# Patient Record
Sex: Male | Born: 2000 | Race: White | Hispanic: No | Marital: Single | Smoking: Never smoker
Health system: Southern US, Community
[De-identification: ages and names within clinical notes are randomized; demographics above are authoritative.]

---
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
## 2021-08-18 ENCOUNTER — Encounter: Payer: Self-pay | Admitting: Emergency Medicine

## 2021-08-18 ENCOUNTER — Other Ambulatory Visit: Payer: Self-pay

## 2021-08-18 ENCOUNTER — Ambulatory Visit
Admission: EM | Admit: 2021-08-18 | Discharge: 2021-08-18 | Disposition: A | Discharge status: Home / Self Care | Payer: BC Managed Care – PPO

## 2021-08-18 DIAGNOSIS — L293 Anogenital pruritus, unspecified: Secondary | ICD-10-CM

## 2021-08-18 NOTE — ED Triage Notes (Addendum)
Patient states that his girlfriend has genital herpes and has been having some irritation around his foreskin for the past 2 days.  Patient also reports some penile discharge yesterday.  ?

## 2021-08-18 NOTE — ED Provider Notes (Signed)
?[place] ? ? ? ?CSN: [id number] ?Arrival date & time: 08/18/21  1345 ? ? ?  ? ?History   ?Chief Complaint ?Chief Complaint  ?Patient presents with  ? Exposure to STD  ? ? ?HPI ?Bradley Flynn is a 21 y.o. male.  ? ?Patient presents with itching to the testicles for 3 to 4 days and a Jazzlyn Huizenga curd-like thick penile discharge for 1 day that has since resolved.  Endorses that his girlfriend recently was treated for bacterial vaginosis.  Denies dysuria, urinary urgency, frequent, hematuria, penile or testicle swelling, new rash or lesions.  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 ? ?History reviewed. No pertinent past medical history. ? ?Patient Active Problem List  ? Diagnosis Date Noted  ? FINGER PAIN 03/25/2010  ? ? ?History reviewed. No pertinent surgical history. ? ? ? ? ?Home Medications   ? ?Prior to Admission medications   ?Medication Sig Start Date End Date Taking? Authorizing Provider  ?buPROPion (WELLBUTRIN XL) 300 MG 24 hr tablet Take 300 mg by mouth daily. 08/16/21  Yes [provider]  ?traZODone (DESYREL) 50 MG tablet Take 50 mg by mouth at bedtime. 06/25/21  Yes [provider]  ? ? ?Family History ?Family History  ?Problem Relation Age of Onset  ? Asthma Mother   ? ? ?Social History ?Social History  ? ?Tobacco Use  ? Smoking status: Never  ? Smokeless tobacco: Never  ?Vaping Use  ? Vaping Use: Every day  ?Substance Use Topics  ? Alcohol use: Not Currently  ? Drug use: Yes  ?  Types: Marijuana  ? ? ? ?Allergies   ?Patient has no known allergies. ? ? ?Review of Systems ?Review of Systems  ?Constitutional: Negative.   ?Respiratory: Negative.    ?Cardiovascular: Negative.   ?Genitourinary:  Positive for penile discharge. Negative for decreased urine volume, difficulty urinating, dysuria, enuresis, flank pain, frequency, genital sores, hematuria, penile pain, penile swelling, scrotal swelling, testicular pain and urgency.  ?Skin: Negative.   ? ? ?Physical Exam ?Triage Vital Signs ?ED Triage Vitals   ?Enc Vitals Group  ?   BP 08/18/21 1401 134/89  ?   Pulse Rate 08/18/21 1401 79  ?   Resp 08/18/21 1401 14  ?   Temp 08/18/21 1401 98.1 ?F (36.7 ?C)  ?   Temp Source 08/18/21 1401 Oral  ?   SpO2 08/18/21 1401 99 %  ?   Weight 08/18/21 1357 150 lb (68 kg)  ?   Height 08/18/21 1357 5\' 6"  (1.676 m)  ?   Head Circumference --   ?   Peak Flow --   ?   Pain Score 08/18/21 1357 0  ?   Pain Loc --   ?   Pain Edu? --   ?   Excl. in [place]? --   ? ?No data found. ? ?Updated Vital Signs ?BP 134/89 (BP Location: Right Arm)   Pulse 79   Temp 98.1 ?F (36.7 ?C) (Oral)   Resp 14   Ht 5\' 6"  (1.676 m)   Wt 150 lb (68 kg)   SpO2 99%   BMI 24.21 kg/m?  ? ?Visual Acuity ?Right Eye Distance:   ?Left Eye Distance:   ?Bilateral Distance:   ? ?Right Eye Near:   ?Left Eye Near:    ?Bilateral Near:    ? ?Physical Exam ?Constitutional:   ?   Appearance: Normal appearance.  ?HENT:  ?   Head: Normocephalic.  ?Eyes:  ?   Extraocular Movements: Extraocular movements  intact.  ?Pulmonary:  ?   Effort: Pulmonary effort is normal.  ?Genitourinary: ?   Comments: Deferred ?Neurological:  ?   Mental Status: He is alert and oriented to person, place, and time. Mental status is at baseline.  ?Psychiatric:     ?   Mood and Affect: Mood normal.     ?   Behavior: Behavior normal.  ? ? ? ?UC Treatments / Results  ?Labs ?(all labs ordered are listed, but only abnormal results are displayed) ?Labs Reviewed  ?HSV CULTURE AND TYPING  ?CYTOLOGY, (ORAL, ANAL, URETHRAL) ANCILLARY ONLY  ? ? ?EKG ? ? ?Radiology ?No results found. ? ?Procedures ?Procedures (including critical care time) ? ?Medications Ordered in UC ?Medications - No data to display ? ?Initial Impression / Assessment and Plan / UC Course  ?I have reviewed the triage vital signs and the nursing notes. ? ?Pertinent labs & imaging results that were available during my care of the patient were reviewed by me and considered in my medical decision making (see chart for details). ? ?Itching  of the male  genitalia ? ?Symptomology is consistent with fungus, discussed with patient, recommended over-the-counter topical antifungal cream until symptoms have cleared, declined STI testing, unable to swab for HSV as there is no lesion present, discussed with patient, patient may follow-up with urgent care as needed for persistent or reoccurring symptoms ?Final Clinical Impressions(s) / UC Diagnoses  ? ?Final diagnoses:  ?None  ? ?Discharge Instructions   ?None ?  ? ?ED Prescriptions   ?None ?  ? ?PDMP not reviewed this encounter. ?  ?Hans Eden, NP ?08/18/21 1421 ? ?

## 2021-08-18 NOTE — Discharge Instructions (Addendum)
I believe your symptoms today may be used, begin use of a topical antifungal, That can be found in most pharmacy areas, twice daily until area has cleared ? ?If you begin to see any type of blistering or rash you may return to urgent care to be swabbed for HSV 2 ? ?If you continue to have penile discharge you may return for routine STI testing ? ?Your symptoms worsen at any point you may return for reevaluation ?
